# Patient Record
Sex: Male | Born: 1960 | Race: Black or African American | Hispanic: No | Marital: Single | Smoking: Never smoker
Health system: Southern US, Community
[De-identification: ages and names within clinical notes are randomized; demographics above are authoritative.]

## PROBLEM LIST (undated history)

## (undated) DIAGNOSIS — M720 Palmar fascial fibromatosis [Dupuytren]: Secondary | ICD-10-CM

## (undated) HISTORY — PX: TONSILLECTOMY: SUR1361

---

## 2005-09-11 HISTORY — PX: DUPUYTREN CONTRACTURE RELEASE: SHX1478

## 2014-08-11 ENCOUNTER — Other Ambulatory Visit: Payer: Self-pay | Admitting: Orthopedic Surgery

## 2014-09-11 DIAGNOSIS — M720 Palmar fascial fibromatosis [Dupuytren]: Secondary | ICD-10-CM

## 2014-09-11 HISTORY — DX: Palmar fascial fibromatosis (dupuytren): M72.0

## 2014-09-15 ENCOUNTER — Encounter (HOSPITAL_BASED_OUTPATIENT_CLINIC_OR_DEPARTMENT_OTHER): Payer: Self-pay | Admitting: *Deleted

## 2014-09-22 ENCOUNTER — Encounter (HOSPITAL_BASED_OUTPATIENT_CLINIC_OR_DEPARTMENT_OTHER)
Admission: RE | Disposition: A | Discharge status: Home / Self Care | Payer: Self-pay | Source: Ambulatory Visit | Attending: Orthopedic Surgery

## 2014-09-22 ENCOUNTER — Ambulatory Visit (HOSPITAL_BASED_OUTPATIENT_CLINIC_OR_DEPARTMENT_OTHER)
Admission: RE | Admit: 2014-09-22 | Discharge: 2014-09-22 | Disposition: A | Discharge status: Home / Self Care | Payer: BLUE CROSS/BLUE SHIELD | Source: Ambulatory Visit | Attending: Orthopedic Surgery | Admitting: Orthopedic Surgery

## 2014-09-22 ENCOUNTER — Encounter (HOSPITAL_BASED_OUTPATIENT_CLINIC_OR_DEPARTMENT_OTHER): Payer: Self-pay | Admitting: Certified Registered"

## 2014-09-22 ENCOUNTER — Ambulatory Visit (HOSPITAL_BASED_OUTPATIENT_CLINIC_OR_DEPARTMENT_OTHER): Payer: BLUE CROSS/BLUE SHIELD | Admitting: Certified Registered"

## 2014-09-22 DIAGNOSIS — M720 Palmar fascial fibromatosis [Dupuytren]: Secondary | ICD-10-CM | POA: Diagnosis present

## 2014-09-22 DIAGNOSIS — Z880 Allergy status to penicillin: Secondary | ICD-10-CM | POA: Diagnosis not present

## 2014-09-22 HISTORY — PX: FASCIECTOMY: SHX6525

## 2014-09-22 HISTORY — DX: Palmar fascial fibromatosis (dupuytren): M72.0

## 2014-09-22 LAB — POCT HEMOGLOBIN-HEMACUE: Hemoglobin: 15.4 g/dL (ref 13.0–17.0)

## 2014-09-22 SURGERY — FASCIECTOMY, PALM
Anesthesia: General | Site: Hand | Laterality: Right

## 2014-09-22 MED ORDER — HYDROMORPHONE HCL 1 MG/ML IJ SOLN
0.2500 mg | INTRAMUSCULAR | Status: DC | PRN
  Administered 2014-09-22 (×2): 0.5 mg via INTRAVENOUS

## 2014-09-22 MED ORDER — FENTANYL CITRATE 0.05 MG/ML IJ SOLN: 50.0000 ug | INTRAMUSCULAR | Status: DC | PRN

## 2014-09-22 MED ORDER — HYDROCODONE-ACETAMINOPHEN 5-325 MG PO TABS: ORAL_TABLET | ORAL | Status: DC

## 2014-09-22 MED ORDER — LIDOCAINE HCL (CARDIAC) 20 MG/ML IV SOLN
INTRAVENOUS | Status: DC | PRN
  Administered 2014-09-22: 60 mg via INTRAVENOUS

## 2014-09-22 MED ORDER — HYDROMORPHONE HCL 1 MG/ML IJ SOLN
INTRAMUSCULAR | Status: AC
  Filled 2014-09-22: qty 1

## 2014-09-22 MED ORDER — MIDAZOLAM HCL 2 MG/2ML IJ SOLN
INTRAMUSCULAR | Status: AC
  Filled 2014-09-22: qty 2

## 2014-09-22 MED ORDER — CEFAZOLIN SODIUM-DEXTROSE 2-3 GM-% IV SOLR
2.0000 g | INTRAVENOUS | Status: AC
  Administered 2014-09-22: 2 g via INTRAVENOUS

## 2014-09-22 MED ORDER — LACTATED RINGERS IV SOLN
INTRAVENOUS | Status: DC
  Administered 2014-09-22 (×2): via INTRAVENOUS

## 2014-09-22 MED ORDER — MIDAZOLAM HCL 5 MG/5ML IJ SOLN
INTRAMUSCULAR | Status: DC | PRN
  Administered 2014-09-22: 2 mg via INTRAVENOUS

## 2014-09-22 MED ORDER — BUPIVACAINE HCL (PF) 0.25 % IJ SOLN
INTRAMUSCULAR | Status: AC
  Filled 2014-09-22: qty 30

## 2014-09-22 MED ORDER — PROPOFOL 10 MG/ML IV BOLUS
INTRAVENOUS | Status: DC | PRN
  Administered 2014-09-22: 200 mg via INTRAVENOUS

## 2014-09-22 MED ORDER — ONDANSETRON HCL 4 MG/2ML IJ SOLN
INTRAMUSCULAR | Status: DC | PRN
  Administered 2014-09-22: 4 mg via INTRAVENOUS

## 2014-09-22 MED ORDER — BUPIVACAINE HCL (PF) 0.25 % IJ SOLN
INTRAMUSCULAR | Status: DC | PRN
  Administered 2014-09-22: 8 mL

## 2014-09-22 MED ORDER — THROMBIN 5000 UNITS EX SOLR
CUTANEOUS | Status: DC | PRN
  Administered 2014-09-22: 4000 [IU] via TOPICAL

## 2014-09-22 MED ORDER — MIDAZOLAM HCL 2 MG/2ML IJ SOLN: 1.0000 mg | INTRAMUSCULAR | Status: DC | PRN

## 2014-09-22 MED ORDER — CEFAZOLIN SODIUM-DEXTROSE 2-3 GM-% IV SOLR
INTRAVENOUS | Status: AC
  Filled 2014-09-22: qty 50

## 2014-09-22 MED ORDER — PROPOFOL 10 MG/ML IV EMUL
INTRAVENOUS | Status: AC
  Filled 2014-09-22: qty 50

## 2014-09-22 MED ORDER — FENTANYL CITRATE 0.05 MG/ML IJ SOLN
INTRAMUSCULAR | Status: AC
  Filled 2014-09-22: qty 6

## 2014-09-22 MED ORDER — DEXAMETHASONE SODIUM PHOSPHATE 10 MG/ML IJ SOLN
INTRAMUSCULAR | Status: DC | PRN
  Administered 2014-09-22: 10 mg via INTRAVENOUS

## 2014-09-22 MED ORDER — MIDAZOLAM HCL 2 MG/ML PO SYRP: 12.0000 mg | ORAL_SOLUTION | Freq: Once | ORAL | Status: DC | PRN

## 2014-09-22 MED ORDER — THROMBIN 5000 UNITS EX SOLR
CUTANEOUS | Status: AC
  Filled 2014-09-22: qty 5000

## 2014-09-22 MED ORDER — CHLORHEXIDINE GLUCONATE 4 % EX LIQD: 60.0000 mL | Freq: Once | CUTANEOUS | Status: DC

## 2014-09-22 MED ORDER — FENTANYL CITRATE 0.05 MG/ML IJ SOLN
INTRAMUSCULAR | Status: DC | PRN
  Administered 2014-09-22: 25 ug via INTRAVENOUS
  Administered 2014-09-22 (×2): 50 ug via INTRAVENOUS

## 2014-09-22 SURGICAL SUPPLY — 54 items
BANDAGE ELASTIC 3 VELCRO ST LF (GAUZE/BANDAGES/DRESSINGS) ×2 IMPLANT
BLADE MINI RND TIP GREEN BEAV (BLADE) IMPLANT
BLADE SURG 15 STRL LF DISP TIS (BLADE) ×2 IMPLANT
BLADE SURG 15 STRL SS (BLADE) ×2
BNDG COHESIVE 3X5 TAN STRL LF (GAUZE/BANDAGES/DRESSINGS) IMPLANT
BNDG ESMARK 4X9 LF (GAUZE/BANDAGES/DRESSINGS) ×2 IMPLANT
BNDG GAUZE ELAST 4 BULKY (GAUZE/BANDAGES/DRESSINGS) ×2 IMPLANT
CHLORAPREP W/TINT 26ML (MISCELLANEOUS) ×2 IMPLANT
CORDS BIPOLAR (ELECTRODE) ×2 IMPLANT
COVER BACK TABLE 60X90IN (DRAPES) ×2 IMPLANT
COVER MAYO STAND STRL (DRAPES) ×2 IMPLANT
CUFF TOURNIQUET SINGLE 18IN (TOURNIQUET CUFF) ×2 IMPLANT
DECANTER SPIKE VIAL GLASS SM (MISCELLANEOUS) ×2 IMPLANT
DRAPE EXTREMITY T 121X128X90 (DRAPE) ×2 IMPLANT
DRAPE SURG 17X23 STRL (DRAPES) ×2 IMPLANT
GAUZE SPONGE 4X4 12PLY STRL (GAUZE/BANDAGES/DRESSINGS) ×2 IMPLANT
GAUZE XEROFORM 1X8 LF (GAUZE/BANDAGES/DRESSINGS) ×2 IMPLANT
GLOVE BIO SURGEON STRL SZ7.5 (GLOVE) ×2 IMPLANT
GLOVE BIOGEL PI IND STRL 7.0 (GLOVE) ×1 IMPLANT
GLOVE BIOGEL PI IND STRL 8 (GLOVE) ×1 IMPLANT
GLOVE BIOGEL PI IND STRL 8.5 (GLOVE) ×1 IMPLANT
GLOVE BIOGEL PI INDICATOR 7.0 (GLOVE) ×1
GLOVE BIOGEL PI INDICATOR 8 (GLOVE) ×1
GLOVE BIOGEL PI INDICATOR 8.5 (GLOVE) ×1
GLOVE ECLIPSE 6.5 STRL STRAW (GLOVE) ×2 IMPLANT
GLOVE EXAM NITRILE LRG STRL (GLOVE) ×2 IMPLANT
GLOVE SURG ORTHO 8.0 STRL STRW (GLOVE) ×2 IMPLANT
GOWN STRL REUS W/ TWL LRG LVL3 (GOWN DISPOSABLE) ×1 IMPLANT
GOWN STRL REUS W/TWL LRG LVL3 (GOWN DISPOSABLE) ×1
GOWN STRL REUS W/TWL XL LVL3 (GOWN DISPOSABLE) ×4 IMPLANT
LOOP VESSEL MAXI BLUE (MISCELLANEOUS) ×2 IMPLANT
NDL SAFETY ECLIPSE 18X1.5 (NEEDLE) ×1 IMPLANT
NEEDLE HYPO 18GX1.5 SHARP (NEEDLE) ×1
NEEDLE HYPO 25X1 1.5 SAFETY (NEEDLE) ×2 IMPLANT
NS IRRIG 1000ML POUR BTL (IV SOLUTION) ×2 IMPLANT
PACK BASIN DAY SURGERY FS (CUSTOM PROCEDURE TRAY) ×2 IMPLANT
PAD CAST 3X4 CTTN HI CHSV (CAST SUPPLIES) ×1 IMPLANT
PADDING CAST ABS 3INX4YD NS (CAST SUPPLIES)
PADDING CAST ABS 4INX4YD NS (CAST SUPPLIES)
PADDING CAST ABS COTTON 3X4 (CAST SUPPLIES) IMPLANT
PADDING CAST ABS COTTON 4X4 ST (CAST SUPPLIES) IMPLANT
PADDING CAST COTTON 3X4 STRL (CAST SUPPLIES) ×1
SLEEVE SCD COMPRESS KNEE MED (MISCELLANEOUS) ×2 IMPLANT
SPLINT PLASTER CAST XFAST 3X15 (CAST SUPPLIES) IMPLANT
SPLINT PLASTER XTRA FASTSET 3X (CAST SUPPLIES)
STOCKINETTE 4X48 STRL (DRAPES) ×2 IMPLANT
SUT ETHILON 4 0 PS 2 18 (SUTURE) ×4 IMPLANT
SUT SILK 2 0 FS (SUTURE) IMPLANT
SUT SILK 4 0 PS 2 (SUTURE) ×2 IMPLANT
SUT VICRYL RAPIDE 4/0 PS 2 (SUTURE) IMPLANT
SYR BULB 3OZ (MISCELLANEOUS) ×2 IMPLANT
SYR CONTROL 10ML LL (SYRINGE) ×2 IMPLANT
TOWEL OR 17X24 6PK STRL BLUE (TOWEL DISPOSABLE) ×4 IMPLANT
UNDERPAD 30X30 INCONTINENT (UNDERPADS AND DIAPERS) IMPLANT

## 2014-09-22 NOTE — Anesthesia Preprocedure Evaluation (Addendum)
Anesthesia Evaluation  Patient identified by MRN, date of birth, ID band Patient awake    Reviewed: Allergy & Precautions, NPO status   Airway        Dental   Pulmonary  breath sounds clear to auscultation        Cardiovascular negative cardio ROS  Rhythm:Regular Rate:Normal     Neuro/Psych    GI/Hepatic Neg liver ROS,   Endo/Other  negative endocrine ROS  Renal/GU negative Renal ROS     Musculoskeletal   Abdominal   Peds  Hematology   Anesthesia Other Findings   Reproductive/Obstetrics                            Anesthesia Physical Anesthesia Plan  ASA: II  Anesthesia Plan: General   Post-op Pain Management:    Induction: Intravenous  Airway Management Planned: LMA  Additional Equipment:   Intra-op Plan:   Post-operative Plan: Extubation in OR  Informed Consent: I have reviewed the patients History and Physical, chart, labs and discussed the procedure including the risks, benefits and alternatives for the proposed anesthesia with the patient or authorized representative who has indicated his/her understanding and acceptance.     Plan Discussed with: CRNA and Anesthesiologist  Anesthesia Plan Comments:         Anesthesia Quick Evaluation

## 2014-09-22 NOTE — Anesthesia Postprocedure Evaluation (Signed)
  Anesthesia Post-op Note  Patient: Edgar Rodriguez  Procedure(s) Performed: Procedure(s): RIGHT PALM AND SMALL FINGER FASCIECTOMY (Right)  Patient Location: PACU  Anesthesia Type: General   Level of Consciousness: awake, alert  and oriented  Airway and Oxygen Therapy: Patient Spontanous Breathing  Post-op Pain: mild  Post-op Assessment: Post-op Vital signs reviewed  Post-op Vital Signs: Reviewed  Last Vitals:  Filed Vitals:   09/22/14 1100  BP: 120/73  Pulse: 64  Temp: 36.7 C  Resp: 16    Complications: No apparent anesthesia complications

## 2014-09-22 NOTE — H&P (Signed)
  Edgar Rodriguez is an 54 y.o. male.   Chief Complaint: right small finger Dupuytren's contracture HPI: 54 yo rhd male states he has had Dupuytren's contracture right small finger for many years.  Had fasciectomy in 2007 with subsequent recurrence.  PIP joint contracture.  He wishes to have repeat fasciectomy.  Past Medical History  Diagnosis Date  . Dupuytren's contracture of right hand 09/2014    right small finger    Past Surgical History  Procedure Laterality Date  . Tonsillectomy      age 89  . Dupuytren contracture release Right 2007    History reviewed. No pertinent family history. Social History:  reports that he has never smoked. He has never used smokeless tobacco. He reports that he drinks alcohol. He reports that he does not use illicit drugs.  Allergies:  Allergies  Allergen Reactions  . Penicillins Hives and Swelling    No prescriptions prior to admission    Results for orders placed or performed during the hospital encounter of 09/22/14 (from the past 48 hour(s))  Hemoglobin-hemacue, POC     Status: None   Collection Time: 09/22/14  7:40 AM  Result Value Ref Range   Hemoglobin 15.4 13.0 - 17.0 g/dL    No results found.   A comprehensive review of systems was negative.  Blood pressure 155/80, pulse 73, temperature 98.1 F (36.7 C), temperature source Oral, resp. rate 18, height 6' (1.829 m), weight 94.915 kg (209 lb 4 oz), SpO2 100 %.  General appearance: alert, cooperative and appears stated age Head: Normocephalic, without obvious abnormality, atraumatic Neck: supple, symmetrical, trachea midline Resp: clear to auscultation bilaterally Cardio: regular rate and rhythm GI: non tender Extremities: intact sensation and capillary refill all digits.  +epl/fpl/io.  no wounds. Pulses: 2+ and symmetric Skin: Skin color, texture, turgor normal. No rashes or lesions Neurologic: Grossly normal Incision/Wound: none  Assessment/Plan Right small finger  Dupuytren's contracture.  Non operative and operative treatment options were discussed with the patient and patient wishes to proceed with operative treatment. He wishes to have repeat fasciectomy.  Risks, benefits, and alternatives of surgery were discussed and the patient agrees with the plan of care.   Roel Douthat R 09/22/2014, 8:31 AM

## 2014-09-22 NOTE — Transfer of Care (Signed)
Immediate Anesthesia Transfer of Care Note  Patient: Edgar Rodriguez  Procedure(s) Performed: Procedure(s): RIGHT PALM AND SMALL FINGER FASCIECTOMY (Right)  Patient Location: PACU  Anesthesia Type:General  Level of Consciousness: awake and patient cooperative  Airway & Oxygen Therapy: Patient Spontanous Breathing and Patient connected to face mask oxygen  Post-op Assessment: Report given to PACU RN and Post -op Vital signs reviewed and stable  Post vital signs: Reviewed and stable  Complications: No apparent anesthesia complications

## 2014-09-22 NOTE — Op Note (Signed)
[id number] 

## 2014-09-22 NOTE — Anesthesia Procedure Notes (Signed)
Procedure Name: LMA Insertion Date/Time: 09/22/2014 8:37 AM Performed by: Sayed Apostol Pre-anesthesia Checklist: Patient identified, Emergency Drugs available, Suction available and Patient being monitored Patient Re-evaluated:Patient Re-evaluated prior to inductionOxygen Delivery Method: Circle System Utilized Preoxygenation: Pre-oxygenation with 100% oxygen Intubation Type: IV induction Ventilation: Mask ventilation without difficulty LMA: LMA inserted LMA Size: 4.0 Number of attempts: 1 Airway Equipment and Method: bite block Placement Confirmation: positive ETCO2 Tube secured with: Tape Dental Injury: Teeth and Oropharynx as per pre-operative assessment

## 2014-09-22 NOTE — Discharge Instructions (Addendum)

## 2014-09-22 NOTE — Op Note (Signed)
NAMEAUDRICK, LAMOUREAUX               ACCOUNT NO.:  [id number]  MEDICAL RECORD NO.:  [id number]  LOCATION:                                 FACILITY:  PHYSICIAN:  Leanora Cover, MD             DATE OF BIRTH:  DATE OF PROCEDURE:  09/22/2014 DATE OF DISCHARGE:                              OPERATIVE REPORT   PREOPERATIVE DIAGNOSIS:  Right small finger Dupuytren's contracture including MP and PIP joints.  POSTOPERATIVE DIAGNOSIS:  Right small finger Dupuytren's contracture including MP and PIP joints.  PROCEDURE:  Right palm and small finger fasciectomy with PIP joint release.  SURGEON:  Leanora Cover, MD  ASSISTANT:  Daryll Brod, M.D.  ANESTHESIA:  General.  IV FLUIDS:  Per anesthesia flow sheet.  ESTIMATED BLOOD LOSS:  Minimal.  COMPLICATIONS:  None.  SPECIMENS:  Dupuytren's contracture to Pathology.  TOURNIQUET TIME:  49 minutes.  DISPOSITION:  Stable to PACU.  INDICATIONS:  Mr. Priola is a 54 year old male who had a history of Dupuytren's contracture, right small finger.  This had been treated with fasciectomy one time in 2007.  This recurred at this time, were reexcised.  Risks, benefits, and alternatives to surgery were discussed including risk of blood loss; infection; damage to nerves, vessels, tendons, ligaments, bone; failure of surgery; need for additional surgery; complications with wound healing; continued pain; and recurrence of contracture.  He voiced understanding of these risks and elected to proceed.  OPERATIVE COURSE:  After being identified preoperatively by myself, the patient and I agreed upon procedure and site of procedure.  Surgical site was marked.  Risks, benefits, and alternatives of surgery were reviewed and he wished to proceed.  Surgical consent had been signed. He was given IV Ancef as preoperative antibiotic prophylaxis after a test dose previously, no reaction.  He was transferred to the operating room and placed on the operating room  table in supine position with the right upper extremity on arm board.  General anesthesia was induced by Anesthesiology.  Right upper extremity was prepped and draped in normal sterile orthopedic fashion.  Surgical pause was performed between surgeons, anesthesia, and operating room staff, and all were in agreement as to the patient, procedure, and site of procedure. Tourniquet at the proximal aspect of the extremity was inflated to 250 mmHg after exsanguination of the limb with Esmarch bandage.  The previous incision was followed.  This was carried into subcutaneous tissues by spreading technique.  There is dense adherence of the Dupuytren's cord to the under surface of the skin.  Great care was taken in releasing this.  The radial and ulnar digital nerve and artery were identified and protected throughout the case.  They were traced from proximal to distal freeing them up along the course.  The Dupuytren's cord was adherent to the sheath as well.  It was removed from approximately the palmar flexion crease out to the PIP joint.  The cord was sent to Pathology for examination.  The PIP joint still had some contracture.  It was treated with manipulation.  The sheath was released transversely between the A2 and A4 pulleys.  This provided some additional extension.  Check rein ligaments were released as well. Improved extension of the PIP joint was obtained, but did not come out into full extension.  The wound was copiously irrigated with sterile saline.  It was treated with thrombin spray.  A vessel loop drain was placed as a drain.  4-0 nylon suture was used in an interrupted fashion to reapproximate the skin edges.  The wound was injected with 8 mL of 0.25% plain Marcaine to aid in postoperative analgesia.  He was then dressed with sterile Xeroform, 4x4s, and wrapped with Kerlix and Ace bandage.  The tourniquet deflated at 49 minutes.  Fingertips were pink with brisk capillary refill  after deflation of the tourniquet. Operative drapes were broken down.  The patient was awoken from anesthesia safely.  He was transferred back to stretcher and taken to PACU in stable condition.  I will see him back in the office in 1 week for postoperative followup.  I will give him Norco 5/325, 1-2 p.o. q.6 hours p.r.n. pain dispensed #40.     Leanora Cover, MD     KK/MEDQ  D:  09/22/2014  T:  09/22/2014  Job:  [id number]

## 2014-09-22 NOTE — Brief Op Note (Signed)
09/22/2014  9:49 AM  PATIENT:  Marya Landry  54 y.o. male  PRE-OPERATIVE DIAGNOSIS:  RIGHT SMALL FINGER DUPUYTRENS CONTRACTURE   POST-OPERATIVE DIAGNOSIS:  RIGHT SMALL FINGER DUPUYTRENS CONTRACTURE   PROCEDURE:  Procedure(s): RIGHT PALM AND SMALL FINGER FASCIECTOMY (Right)  SURGEON:  Surgeon(s) and Role:    * Leanora Cover, MD - Primary    * Daryll Brod, MD - Assisting  PHYSICIAN ASSISTANT:   ASSISTANTS: Daryll Brod, MD   ANESTHESIA:   general  EBL:  Total I/O In: 1400 [I.V.:1400] Out: -   BLOOD ADMINISTERED:none  DRAINS: vessel loop drain  LOCAL MEDICATIONS USED:  MARCAINE     SPECIMEN:  Source of Specimen:  right small finger  DISPOSITION OF SPECIMEN:  PATHOLOGY  COUNTS:  YES  TOURNIQUET:   Total Tourniquet Time Documented: Upper Arm (Right) - 49 minutes Total: Upper Arm (Right) - 49 minutes   DICTATION: .Other Dictation: Dictation Number [id number]  PLAN OF CARE: Discharge to home after PACU  PATIENT DISPOSITION:  PACU - hemodynamically stable.

## 2014-09-23 ENCOUNTER — Encounter (HOSPITAL_BASED_OUTPATIENT_CLINIC_OR_DEPARTMENT_OTHER): Payer: Self-pay | Admitting: Orthopedic Surgery

## 2016-02-02 DIAGNOSIS — R7309 Other abnormal glucose: Secondary | ICD-10-CM | POA: Diagnosis not present

## 2016-02-02 DIAGNOSIS — M720 Palmar fascial fibromatosis [Dupuytren]: Secondary | ICD-10-CM | POA: Diagnosis not present

## 2016-02-02 DIAGNOSIS — Z0000 Encounter for general adult medical examination without abnormal findings: Secondary | ICD-10-CM | POA: Diagnosis not present

## 2016-02-09 DIAGNOSIS — Z1212 Encounter for screening for malignant neoplasm of rectum: Secondary | ICD-10-CM | POA: Diagnosis not present

## 2016-02-09 DIAGNOSIS — Z86010 Personal history of colonic polyps: Secondary | ICD-10-CM | POA: Diagnosis not present

## 2016-02-09 DIAGNOSIS — R972 Elevated prostate specific antigen [PSA]: Secondary | ICD-10-CM | POA: Diagnosis not present

## 2016-02-09 DIAGNOSIS — Z1389 Encounter for screening for other disorder: Secondary | ICD-10-CM | POA: Diagnosis not present

## 2016-02-09 DIAGNOSIS — R7309 Other abnormal glucose: Secondary | ICD-10-CM | POA: Diagnosis not present

## 2016-02-09 DIAGNOSIS — Z0000 Encounter for general adult medical examination without abnormal findings: Secondary | ICD-10-CM | POA: Diagnosis not present

## 2016-02-09 DIAGNOSIS — Z800 Family history of malignant neoplasm of digestive organs: Secondary | ICD-10-CM | POA: Diagnosis not present

## 2016-02-09 DIAGNOSIS — Z125 Encounter for screening for malignant neoplasm of prostate: Secondary | ICD-10-CM | POA: Diagnosis not present

## 2016-05-12 DIAGNOSIS — K4020 Bilateral inguinal hernia, without obstruction or gangrene, not specified as recurrent: Secondary | ICD-10-CM | POA: Diagnosis not present

## 2016-05-12 DIAGNOSIS — R972 Elevated prostate specific antigen [PSA]: Secondary | ICD-10-CM | POA: Diagnosis not present

## 2016-06-21 DIAGNOSIS — Z86010 Personal history of colonic polyps: Secondary | ICD-10-CM | POA: Diagnosis not present

## 2016-07-24 DIAGNOSIS — R7309 Other abnormal glucose: Secondary | ICD-10-CM | POA: Diagnosis not present

## 2016-07-24 DIAGNOSIS — H1131 Conjunctival hemorrhage, right eye: Secondary | ICD-10-CM | POA: Diagnosis not present

## 2016-07-24 DIAGNOSIS — H1031 Unspecified acute conjunctivitis, right eye: Secondary | ICD-10-CM | POA: Diagnosis not present

## 2016-07-24 DIAGNOSIS — Z6827 Body mass index (BMI) 27.0-27.9, adult: Secondary | ICD-10-CM | POA: Diagnosis not present

## 2016-07-24 DIAGNOSIS — H04123 Dry eye syndrome of bilateral lacrimal glands: Secondary | ICD-10-CM | POA: Diagnosis not present

## 2016-11-15 DIAGNOSIS — R972 Elevated prostate specific antigen [PSA]: Secondary | ICD-10-CM | POA: Diagnosis not present

## 2016-11-22 DIAGNOSIS — R972 Elevated prostate specific antigen [PSA]: Secondary | ICD-10-CM | POA: Diagnosis not present

## 2017-02-08 DIAGNOSIS — R7309 Other abnormal glucose: Secondary | ICD-10-CM | POA: Diagnosis not present

## 2017-02-08 DIAGNOSIS — Z0000 Encounter for general adult medical examination without abnormal findings: Secondary | ICD-10-CM | POA: Diagnosis not present

## 2017-02-08 DIAGNOSIS — Z125 Encounter for screening for malignant neoplasm of prostate: Secondary | ICD-10-CM | POA: Diagnosis not present

## 2017-02-14 DIAGNOSIS — Z0000 Encounter for general adult medical examination without abnormal findings: Secondary | ICD-10-CM | POA: Diagnosis not present

## 2017-02-14 DIAGNOSIS — R7309 Other abnormal glucose: Secondary | ICD-10-CM | POA: Diagnosis not present

## 2017-02-14 DIAGNOSIS — Z1389 Encounter for screening for other disorder: Secondary | ICD-10-CM | POA: Diagnosis not present

## 2017-02-14 DIAGNOSIS — M720 Palmar fascial fibromatosis [Dupuytren]: Secondary | ICD-10-CM | POA: Diagnosis not present

## 2017-02-14 DIAGNOSIS — R972 Elevated prostate specific antigen [PSA]: Secondary | ICD-10-CM | POA: Diagnosis not present

## 2017-02-14 DIAGNOSIS — Z800 Family history of malignant neoplasm of digestive organs: Secondary | ICD-10-CM | POA: Diagnosis not present

## 2017-02-28 DIAGNOSIS — M720 Palmar fascial fibromatosis [Dupuytren]: Secondary | ICD-10-CM | POA: Diagnosis not present

## 2017-08-10 DIAGNOSIS — R972 Elevated prostate specific antigen [PSA]: Secondary | ICD-10-CM | POA: Diagnosis not present

## 2017-08-22 DIAGNOSIS — N400 Enlarged prostate without lower urinary tract symptoms: Secondary | ICD-10-CM | POA: Diagnosis not present

## 2017-08-22 DIAGNOSIS — R972 Elevated prostate specific antigen [PSA]: Secondary | ICD-10-CM | POA: Diagnosis not present

## 2017-08-24 DIAGNOSIS — H6123 Impacted cerumen, bilateral: Secondary | ICD-10-CM | POA: Diagnosis not present

## 2017-08-24 DIAGNOSIS — Z6828 Body mass index (BMI) 28.0-28.9, adult: Secondary | ICD-10-CM | POA: Diagnosis not present

## 2017-10-22 DIAGNOSIS — H5203 Hypermetropia, bilateral: Secondary | ICD-10-CM | POA: Diagnosis not present

## 2017-10-22 DIAGNOSIS — H524 Presbyopia: Secondary | ICD-10-CM | POA: Diagnosis not present

## 2018-02-20 DIAGNOSIS — R7309 Other abnormal glucose: Secondary | ICD-10-CM | POA: Diagnosis not present

## 2018-02-20 DIAGNOSIS — Z125 Encounter for screening for malignant neoplasm of prostate: Secondary | ICD-10-CM | POA: Diagnosis not present

## 2018-02-20 DIAGNOSIS — Z0000 Encounter for general adult medical examination without abnormal findings: Secondary | ICD-10-CM | POA: Diagnosis not present

## 2018-02-28 DIAGNOSIS — Z1212 Encounter for screening for malignant neoplasm of rectum: Secondary | ICD-10-CM | POA: Diagnosis not present

## 2018-03-04 DIAGNOSIS — R740 Nonspecific elevation of levels of transaminase and lactic acid dehydrogenase [LDH]: Secondary | ICD-10-CM | POA: Diagnosis not present

## 2018-03-04 DIAGNOSIS — Z1389 Encounter for screening for other disorder: Secondary | ICD-10-CM | POA: Diagnosis not present

## 2018-03-04 DIAGNOSIS — R7309 Other abnormal glucose: Secondary | ICD-10-CM | POA: Diagnosis not present

## 2018-03-04 DIAGNOSIS — R972 Elevated prostate specific antigen [PSA]: Secondary | ICD-10-CM | POA: Diagnosis not present

## 2018-03-04 DIAGNOSIS — Z0000 Encounter for general adult medical examination without abnormal findings: Secondary | ICD-10-CM | POA: Diagnosis not present

## 2018-03-04 DIAGNOSIS — Z800 Family history of malignant neoplasm of digestive organs: Secondary | ICD-10-CM | POA: Diagnosis not present

## 2018-05-23 DIAGNOSIS — R310 Gross hematuria: Secondary | ICD-10-CM | POA: Diagnosis not present

## 2018-05-23 DIAGNOSIS — R8271 Bacteriuria: Secondary | ICD-10-CM | POA: Diagnosis not present

## 2018-06-26 DIAGNOSIS — R972 Elevated prostate specific antigen [PSA]: Secondary | ICD-10-CM | POA: Diagnosis not present

## 2018-07-03 DIAGNOSIS — R310 Gross hematuria: Secondary | ICD-10-CM | POA: Diagnosis not present

## 2018-07-03 DIAGNOSIS — R972 Elevated prostate specific antigen [PSA]: Secondary | ICD-10-CM | POA: Diagnosis not present

## 2018-11-12 DIAGNOSIS — H338 Other retinal detachments: Secondary | ICD-10-CM | POA: Diagnosis not present

## 2018-11-12 DIAGNOSIS — H5203 Hypermetropia, bilateral: Secondary | ICD-10-CM | POA: Diagnosis not present

## 2018-11-12 DIAGNOSIS — R7303 Prediabetes: Secondary | ICD-10-CM | POA: Diagnosis not present

## 2018-11-12 DIAGNOSIS — H524 Presbyopia: Secondary | ICD-10-CM | POA: Diagnosis not present

## 2019-03-03 DIAGNOSIS — R7309 Other abnormal glucose: Secondary | ICD-10-CM | POA: Diagnosis not present

## 2019-03-03 DIAGNOSIS — R82998 Other abnormal findings in urine: Secondary | ICD-10-CM | POA: Diagnosis not present

## 2019-03-03 DIAGNOSIS — Z125 Encounter for screening for malignant neoplasm of prostate: Secondary | ICD-10-CM | POA: Diagnosis not present

## 2019-03-03 DIAGNOSIS — R740 Nonspecific elevation of levels of transaminase and lactic acid dehydrogenase [LDH]: Secondary | ICD-10-CM | POA: Diagnosis not present

## 2019-03-03 DIAGNOSIS — Z0000 Encounter for general adult medical examination without abnormal findings: Secondary | ICD-10-CM | POA: Diagnosis not present

## 2019-03-10 DIAGNOSIS — E559 Vitamin D deficiency, unspecified: Secondary | ICD-10-CM | POA: Diagnosis not present

## 2019-03-10 DIAGNOSIS — Z0000 Encounter for general adult medical examination without abnormal findings: Secondary | ICD-10-CM | POA: Diagnosis not present

## 2019-03-10 DIAGNOSIS — Z800 Family history of malignant neoplasm of digestive organs: Secondary | ICD-10-CM | POA: Diagnosis not present

## 2019-03-10 DIAGNOSIS — R7309 Other abnormal glucose: Secondary | ICD-10-CM | POA: Diagnosis not present

## 2019-03-10 DIAGNOSIS — Z1331 Encounter for screening for depression: Secondary | ICD-10-CM | POA: Diagnosis not present

## 2019-03-10 DIAGNOSIS — Z86010 Personal history of colonic polyps: Secondary | ICD-10-CM | POA: Diagnosis not present

## 2019-03-10 DIAGNOSIS — R972 Elevated prostate specific antigen [PSA]: Secondary | ICD-10-CM | POA: Diagnosis not present

## 2019-06-27 DIAGNOSIS — R972 Elevated prostate specific antigen [PSA]: Secondary | ICD-10-CM | POA: Diagnosis not present

## 2019-07-04 DIAGNOSIS — R972 Elevated prostate specific antigen [PSA]: Secondary | ICD-10-CM | POA: Diagnosis not present

## 2019-07-04 DIAGNOSIS — R310 Gross hematuria: Secondary | ICD-10-CM | POA: Diagnosis not present

## 2019-09-19 DIAGNOSIS — M720 Palmar fascial fibromatosis [Dupuytren]: Secondary | ICD-10-CM | POA: Diagnosis not present

## 2019-09-22 DIAGNOSIS — M25511 Pain in right shoulder: Secondary | ICD-10-CM | POA: Diagnosis not present

## 2019-09-22 DIAGNOSIS — M7551 Bursitis of right shoulder: Secondary | ICD-10-CM | POA: Diagnosis not present

## 2019-10-13 DIAGNOSIS — R972 Elevated prostate specific antigen [PSA]: Secondary | ICD-10-CM | POA: Diagnosis not present

## 2019-10-19 ENCOUNTER — Ambulatory Visit: Payer: BC Managed Care – PPO | Attending: Internal Medicine

## 2019-10-19 DIAGNOSIS — Z23 Encounter for immunization: Secondary | ICD-10-CM | POA: Insufficient documentation

## 2019-10-19 NOTE — Progress Notes (Signed)
   Covid-19 Vaccination Clinic  Name:  Edgar Rodriguez    MRN: [id number] DOB: 09/20/1960  10/19/2019  [name] was observed post Covid-19 immunization for 15 minutes without incidence. He was provided with Vaccine Information Sheet and instruction to access the V-Safe system.   [name] was instructed to call 911 with any severe reactions post vaccine: Marland Kitchen Difficulty breathing  . Swelling of your face and throat  . A fast heartbeat  . A bad rash all over your body  . Dizziness and weakness    Immunizations Administered    Name Date Dose VIS Date Route   Pfizer COVID-19 Vaccine 10/19/2019  4:53 PM 0.3 mL 08/22/2019 Intramuscular   Manufacturer: [place]   Lot: [id number]   [place]: [id number]

## 2019-11-13 ENCOUNTER — Ambulatory Visit: Payer: BC Managed Care – PPO | Attending: Internal Medicine

## 2019-11-13 DIAGNOSIS — Z23 Encounter for immunization: Secondary | ICD-10-CM | POA: Insufficient documentation

## 2019-11-13 NOTE — Progress Notes (Signed)
   Covid-19 Vaccination Clinic  Name:  Edgar Rodriguez    MRN: [id number] DOB: 12-Jul-1961  11/13/2019  Edgar Rodriguez was observed post Covid-19 immunization for 15 minutes without incident. He was provided with Vaccine Information Sheet and instruction to access the V-Safe system.   Edgar Rodriguez was instructed to call 911 with any severe reactions post vaccine: Marland Kitchen Difficulty breathing  . Swelling of face and throat  . A fast heartbeat  . A bad rash all over body  . Dizziness and weakness   Immunizations Administered    Name Date Dose VIS Date Route   Pfizer COVID-19 Vaccine 11/13/2019  3:12 PM 0.3 mL 08/22/2019 Intramuscular   Manufacturer: [place]   Lot: [id number]   [place]: [id number]

## 2019-11-17 DIAGNOSIS — H35711 Central serous chorioretinopathy, right eye: Secondary | ICD-10-CM | POA: Diagnosis not present

## 2019-11-17 DIAGNOSIS — H5203 Hypermetropia, bilateral: Secondary | ICD-10-CM | POA: Diagnosis not present

## 2019-11-17 DIAGNOSIS — R7303 Prediabetes: Secondary | ICD-10-CM | POA: Diagnosis not present

## 2020-01-07 ENCOUNTER — Ambulatory Visit: Payer: BC Managed Care – PPO | Attending: Internal Medicine

## 2020-01-07 DIAGNOSIS — Z20822 Contact with and (suspected) exposure to covid-19: Secondary | ICD-10-CM | POA: Diagnosis not present

## 2020-01-08 LAB — SARS-COV-2, NAA 2 DAY TAT

## 2020-01-08 LAB — NOVEL CORONAVIRUS, NAA: SARS-CoV-2, NAA: NOT DETECTED

## 2020-01-19 DIAGNOSIS — R972 Elevated prostate specific antigen [PSA]: Secondary | ICD-10-CM | POA: Diagnosis not present

## 2020-03-17 DIAGNOSIS — E559 Vitamin D deficiency, unspecified: Secondary | ICD-10-CM | POA: Diagnosis not present

## 2020-03-17 DIAGNOSIS — Z125 Encounter for screening for malignant neoplasm of prostate: Secondary | ICD-10-CM | POA: Diagnosis not present

## 2020-03-17 DIAGNOSIS — R7989 Other specified abnormal findings of blood chemistry: Secondary | ICD-10-CM | POA: Diagnosis not present

## 2020-03-17 DIAGNOSIS — Z0000 Encounter for general adult medical examination without abnormal findings: Secondary | ICD-10-CM | POA: Diagnosis not present

## 2020-03-17 DIAGNOSIS — R7309 Other abnormal glucose: Secondary | ICD-10-CM | POA: Diagnosis not present

## 2020-03-22 DIAGNOSIS — Z1212 Encounter for screening for malignant neoplasm of rectum: Secondary | ICD-10-CM | POA: Diagnosis not present

## 2020-03-24 DIAGNOSIS — R972 Elevated prostate specific antigen [PSA]: Secondary | ICD-10-CM | POA: Diagnosis not present

## 2020-03-24 DIAGNOSIS — R7309 Other abnormal glucose: Secondary | ICD-10-CM | POA: Diagnosis not present

## 2020-03-24 DIAGNOSIS — Z86010 Personal history of colonic polyps: Secondary | ICD-10-CM | POA: Diagnosis not present

## 2020-03-24 DIAGNOSIS — E559 Vitamin D deficiency, unspecified: Secondary | ICD-10-CM | POA: Diagnosis not present

## 2020-03-24 DIAGNOSIS — Z0000 Encounter for general adult medical examination without abnormal findings: Secondary | ICD-10-CM | POA: Diagnosis not present

## 2020-04-19 DIAGNOSIS — N400 Enlarged prostate without lower urinary tract symptoms: Secondary | ICD-10-CM | POA: Diagnosis not present

## 2020-04-30 ENCOUNTER — Other Ambulatory Visit: Payer: Self-pay | Admitting: Urology

## 2020-04-30 DIAGNOSIS — R972 Elevated prostate specific antigen [PSA]: Secondary | ICD-10-CM

## 2020-05-24 ENCOUNTER — Other Ambulatory Visit: Payer: Self-pay | Admitting: Urology

## 2020-05-25 ENCOUNTER — Other Ambulatory Visit: Payer: Self-pay

## 2020-05-25 ENCOUNTER — Ambulatory Visit
Admission: RE | Admit: 2020-05-25 | Discharge: 2020-05-25 | Disposition: A | Discharge status: Home / Self Care | Payer: BC Managed Care – PPO | Source: Ambulatory Visit | Attending: Urology | Admitting: Urology

## 2020-05-25 DIAGNOSIS — R972 Elevated prostate specific antigen [PSA]: Secondary | ICD-10-CM

## 2020-05-25 DIAGNOSIS — N400 Enlarged prostate without lower urinary tract symptoms: Secondary | ICD-10-CM | POA: Diagnosis not present

## 2020-05-25 MED ORDER — GADOBENATE DIMEGLUMINE 529 MG/ML IV SOLN
19.0000 mL | Freq: Once | INTRAVENOUS | Status: AC | PRN
  Administered 2020-05-25: 19 mL via INTRAVENOUS

## 2020-06-11 DIAGNOSIS — N4232 Atypical small acinar proliferation of prostate: Secondary | ICD-10-CM | POA: Diagnosis not present

## 2020-06-11 DIAGNOSIS — D075 Carcinoma in situ of prostate: Secondary | ICD-10-CM | POA: Diagnosis not present

## 2020-06-11 DIAGNOSIS — C61 Malignant neoplasm of prostate: Secondary | ICD-10-CM | POA: Diagnosis not present

## 2020-06-11 DIAGNOSIS — R972 Elevated prostate specific antigen [PSA]: Secondary | ICD-10-CM | POA: Diagnosis not present

## 2020-07-12 DIAGNOSIS — C61 Malignant neoplasm of prostate: Secondary | ICD-10-CM | POA: Diagnosis not present

## 2020-11-29 DIAGNOSIS — R7303 Prediabetes: Secondary | ICD-10-CM | POA: Diagnosis not present

## 2020-11-29 DIAGNOSIS — H524 Presbyopia: Secondary | ICD-10-CM | POA: Diagnosis not present

## 2020-11-29 DIAGNOSIS — H2513 Age-related nuclear cataract, bilateral: Secondary | ICD-10-CM | POA: Diagnosis not present

## 2020-11-29 DIAGNOSIS — H35711 Central serous chorioretinopathy, right eye: Secondary | ICD-10-CM | POA: Diagnosis not present

## 2020-12-23 DIAGNOSIS — C61 Malignant neoplasm of prostate: Secondary | ICD-10-CM | POA: Diagnosis not present

## 2020-12-31 DIAGNOSIS — C61 Malignant neoplasm of prostate: Secondary | ICD-10-CM | POA: Diagnosis not present

## 2021-03-23 DIAGNOSIS — Z0000 Encounter for general adult medical examination without abnormal findings: Secondary | ICD-10-CM | POA: Diagnosis not present

## 2021-03-23 DIAGNOSIS — E559 Vitamin D deficiency, unspecified: Secondary | ICD-10-CM | POA: Diagnosis not present

## 2021-03-23 DIAGNOSIS — Z125 Encounter for screening for malignant neoplasm of prostate: Secondary | ICD-10-CM | POA: Diagnosis not present

## 2021-03-23 DIAGNOSIS — R7309 Other abnormal glucose: Secondary | ICD-10-CM | POA: Diagnosis not present

## 2021-03-29 DIAGNOSIS — R82998 Other abnormal findings in urine: Secondary | ICD-10-CM | POA: Diagnosis not present

## 2021-03-30 DIAGNOSIS — Z1339 Encounter for screening examination for other mental health and behavioral disorders: Secondary | ICD-10-CM | POA: Diagnosis not present

## 2021-03-30 DIAGNOSIS — Z0000 Encounter for general adult medical examination without abnormal findings: Secondary | ICD-10-CM | POA: Diagnosis not present

## 2021-03-30 DIAGNOSIS — Z1331 Encounter for screening for depression: Secondary | ICD-10-CM | POA: Diagnosis not present

## 2021-03-30 DIAGNOSIS — E559 Vitamin D deficiency, unspecified: Secondary | ICD-10-CM | POA: Diagnosis not present

## 2021-04-29 ENCOUNTER — Other Ambulatory Visit: Payer: Self-pay | Admitting: Urology

## 2021-04-29 DIAGNOSIS — C61 Malignant neoplasm of prostate: Secondary | ICD-10-CM

## 2021-06-02 ENCOUNTER — Ambulatory Visit
Admission: RE | Admit: 2021-06-02 | Discharge: 2021-06-02 | Disposition: A | Discharge status: Home / Self Care | Payer: BC Managed Care – PPO | Source: Ambulatory Visit | Attending: Urology | Admitting: Urology

## 2021-06-02 ENCOUNTER — Other Ambulatory Visit: Payer: Self-pay

## 2021-06-02 DIAGNOSIS — C61 Malignant neoplasm of prostate: Secondary | ICD-10-CM

## 2021-06-02 DIAGNOSIS — N400 Enlarged prostate without lower urinary tract symptoms: Secondary | ICD-10-CM | POA: Diagnosis not present

## 2021-06-02 IMAGING — MR MR PROSTATE WO/W CM
12 series · 48 of 48 positions shown · IV contrast (multihance)
Comparison: None.

CLINICAL DATA: Elevated PSA level of 6.632 ng/mL on [DATE].
Biopsy on [DATE] Gleason 3+3=6 prostate adenocarcinoma in the
right anterior peripheral zone mid gland (prior region of interest #
2). Atypical small acinar proliferation at prior region of interest
# 1.

EXAM:
MR PROSTATE WITHOUT AND WITH CONTRAST
TECHNIQUE: Multiplanar multisequence MRI images were obtained of the pelvis
centered about the prostate. Pre and post contrast images were
obtained.
CONTRAST:  19mL MULTIHANCE GADOBENATE DIMEGLUMINE 529 MG/ML IV SOLN

[Series 3: T2 · coronal · 3.0mm · 0.56mm/px · 1 of 25 slices shown (1 of 3)]
[im 1/25]
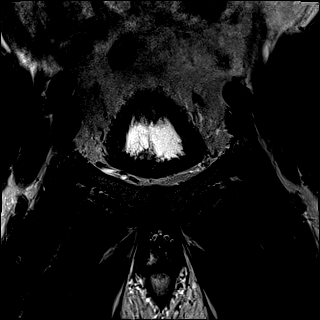

[Series 4: T1 · axial · 5.0mm · 1.25mm/px · z∈[-19,+176]mm · 2 of 80 slices shown]
[im 1/80]
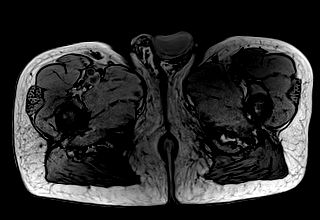
[im 80/80]
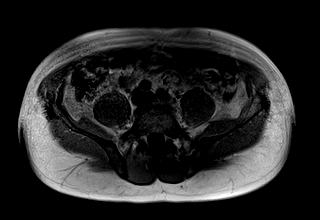

[Series 5: DWI · axial · 3.0mm · 1.75mm/px · z∈[+26,+95]mm · 2 of 72 slices shown (1 of 3)]
[im 1/72]
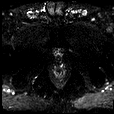
[im 72/72]
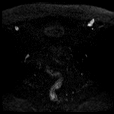

[Series 6: DWI · axial · 3.0mm · 1.75mm/px · 1 of 24 slices shown (2 of 3)]
[im 1/24]
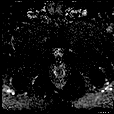

[Series 7: DWI · axial · 3.0mm · 1.75mm/px · 1 of 24 slices shown (3 of 3)]
[im 1/24]
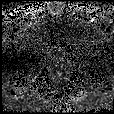

[Series 8: T2 · axial · 3.0mm · 0.56mm/px · 1 of 23 slices shown (2 of 3)]
[im 1/23]
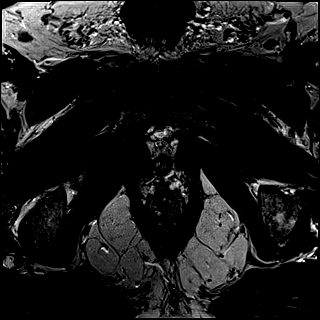

[Series 9: T2 · axial · 1.0mm · 1.04mm/px · z∈[+25,+96]mm · 2 of 72 slices shown (3 of 3)]
[im 1/72]
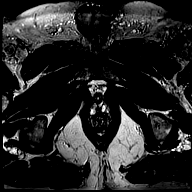
[im 72/72]
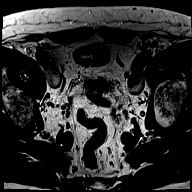

[Series 10: pre t1_twist_tra_dyn · axial · non-contrast · 3.5mm · 0.83mm/px · 1 of 20 slices shown]
[im 1/20]
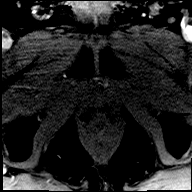

[Series 11: post t1_twist_tra_dyn-copy center · axial · non-contrast · 3.5mm · 0.83mm/px · z∈[+27,+94]mm · 17 of 600 slices shown]
[im 1/600]
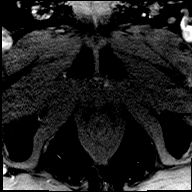
[im 38/600]
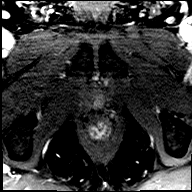
[im 75/600]
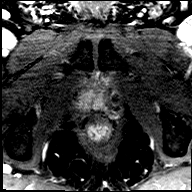
[im 113/600]
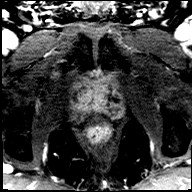
[im 150/600]
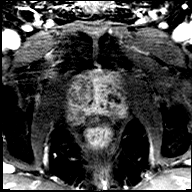
[im 188/600]
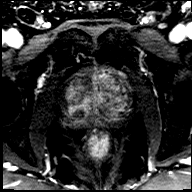
[im 225/600]
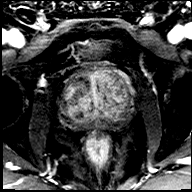
[im 263/600]
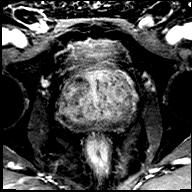
[im 300/600]
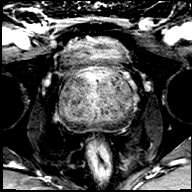
[im 337/600]
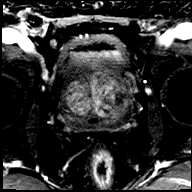
[im 375/600]
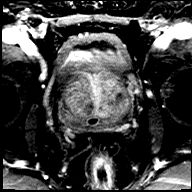
[im 412/600]
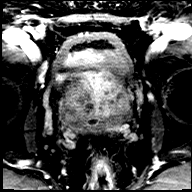
[im 450/600]
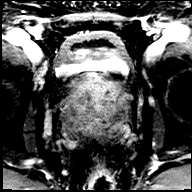
[im 487/600]
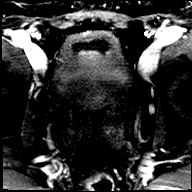
[im 525/600]
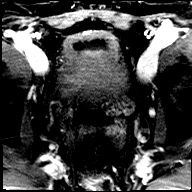
[im 562/600]
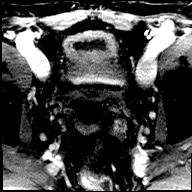
[im 600/600]
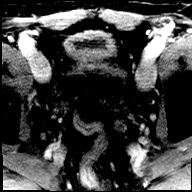

[Series 12: post t1_twist_tra_dyn-copy cent_sub · axial · 3.5mm · 0.83mm/px · z∈[+27,+94]mm · 16 of 572 slices shown]
[im 1/572]
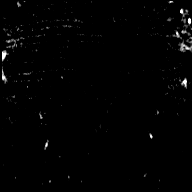
[im 39/572]
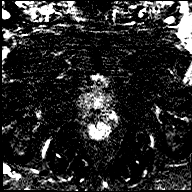
[im 77/572]
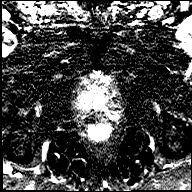
[im 115/572]
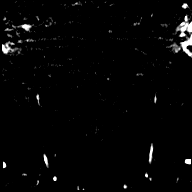
[im 153/572]
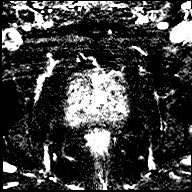
[im 191/572]
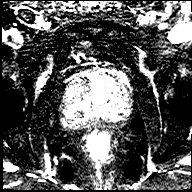
[im 229/572]
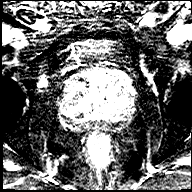
[im 267/572]
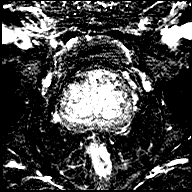
[im 305/572]
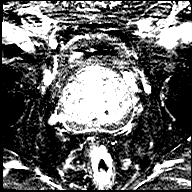
[im 343/572]
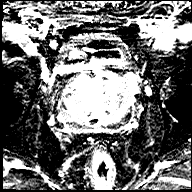
[im 381/572]
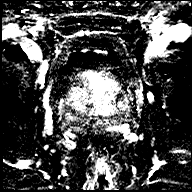
[im 419/572]
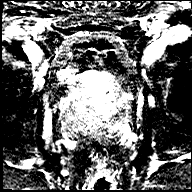
[im 457/572]
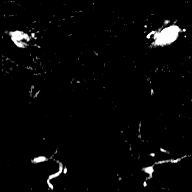
[im 495/572]
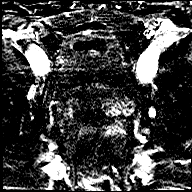
[im 533/572]
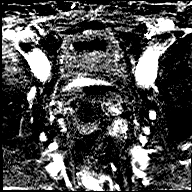
[im 572/572]
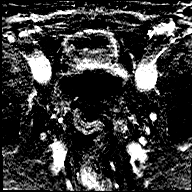

[Series 13: t1_vibe_dixon_tra_f · axial · 2.5mm · 0.91mm/px · z∈[-20,+177]mm · 2 of 80 slices shown]
[im 1/80]
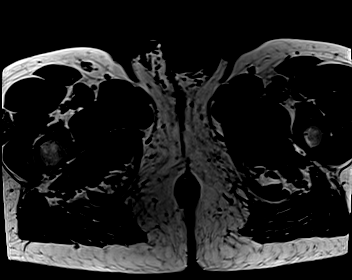
[im 80/80]
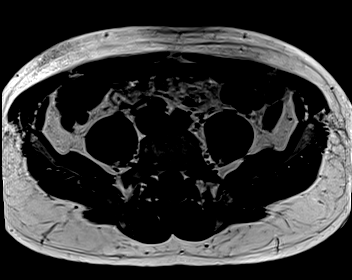

[Series 14: t1_vibe_dixon_tra_w · axial · 2.5mm · 0.91mm/px · z∈[-20,+177]mm · 2 of 80 slices shown]
[im 1/80]
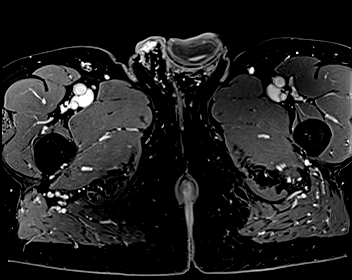
[im 80/80]
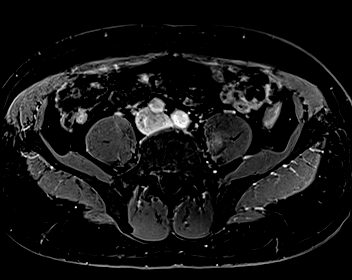

[48 of 48 positions shown; findings below may reference images not displayed]

FINDINGS: Prostate:

Region of interest # 1: PI-RADS category 4 lesion of the right
posterolateral peripheral zone of the base with low T2 signal and
mild focal early enhancement. This measures 0.99 cc (1.3 by 0.7 by
1.5 cm) and previously measured 0.82 cc. This lesion is shown on
image 8 series 8 and corresponds to the previous region of interest
# 1 discussed on prior MRI from [DATE].

Region of interest # 2: PI-RADS category 3 lesion of the right
anterior peripheral zone in the mid gland, with reduced T2 signal
but no focal early enhancement or substantially restricted
diffusion. This measures 0.27 cc (1.2 by 0.4 by 0.9 cm and is shown
for example on image 15 series 8. This was previously measured at
0.40 cc and was designated region of interest # 2 on the prior exam.

Region of interest # 3: PI-RADS category 3 lesion of the left
posterolateral peripheral zone at the apex, with reduced T2 signal
but no focal early enhancement or restricted diffusion. There is
some thinning of the apex in this vicinity, raising the possibility
that this could be a postinflammatory finding. Minimally increased
in conspicuity today compared to [DATE], shown for example on
image 19 series 8. This small lesion measures 0.32 cc (1.3 by 0.5 by
0.7 cm).

Encapsulated nodularity of the transition zone compatible with
benign prostatic hypertrophy.

Volume: 3D volumetric analysis: Prostate volume 102.44 cc (6.4 by
5.6 by 5.7 cm). This is minimally enlarged from prior measurement of
96.23 cc on [DATE].

Transcapsular spread:  Absent

Seminal vesicle involvement: Absent

Neurovascular bundle involvement: Absent

Pelvic adenopathy: Absent

Bone metastasis: Absent

Other findings: No supplemental non-categorized findings.
IMPRESSION: 1. Previous region of interest # 1 and # 2 are morphologically
relatively similar to the prior exam and have similar imaging
characteristics.
2. Slight increase in conspicuity of a small PI-RADS category 3
lesion of the left posterolateral peripheral zone the apex,
designated as region of interest # 3. This probably has an increased
likelihood of representing scarring given the thinning of the
peripheral zone in this vicinity, but qualifies as PI-RADS category
3.
3. Benign prostatic hypertrophy with prostatomegaly, the prostate
has mildly increased in volume, currently 102.44 cc.

## 2021-06-02 MED ORDER — GADOBENATE DIMEGLUMINE 529 MG/ML IV SOLN
19.0000 mL | Freq: Once | INTRAVENOUS | Status: AC | PRN
  Administered 2021-06-02: 19 mL via INTRAVENOUS

## 2021-07-12 DIAGNOSIS — C61 Malignant neoplasm of prostate: Secondary | ICD-10-CM | POA: Diagnosis not present

## 2021-07-15 DIAGNOSIS — C61 Malignant neoplasm of prostate: Secondary | ICD-10-CM | POA: Diagnosis not present

## 2021-07-15 DIAGNOSIS — D075 Carcinoma in situ of prostate: Secondary | ICD-10-CM | POA: Diagnosis not present

## 2021-07-28 DIAGNOSIS — R310 Gross hematuria: Secondary | ICD-10-CM | POA: Diagnosis not present

## 2021-11-29 DIAGNOSIS — C61 Malignant neoplasm of prostate: Secondary | ICD-10-CM | POA: Diagnosis not present

## 2021-11-30 DIAGNOSIS — H2513 Age-related nuclear cataract, bilateral: Secondary | ICD-10-CM | POA: Diagnosis not present

## 2021-11-30 DIAGNOSIS — R7303 Prediabetes: Secondary | ICD-10-CM | POA: Diagnosis not present

## 2021-11-30 DIAGNOSIS — H52203 Unspecified astigmatism, bilateral: Secondary | ICD-10-CM | POA: Diagnosis not present

## 2021-11-30 DIAGNOSIS — H5203 Hypermetropia, bilateral: Secondary | ICD-10-CM | POA: Diagnosis not present

## 2022-02-16 DIAGNOSIS — K635 Polyp of colon: Secondary | ICD-10-CM | POA: Diagnosis not present

## 2022-02-16 DIAGNOSIS — K648 Other hemorrhoids: Secondary | ICD-10-CM | POA: Diagnosis not present

## 2022-02-16 DIAGNOSIS — Z86010 Personal history of colonic polyps: Secondary | ICD-10-CM | POA: Diagnosis not present

## 2022-03-01 DIAGNOSIS — R3914 Feeling of incomplete bladder emptying: Secondary | ICD-10-CM | POA: Diagnosis not present

## 2022-03-01 DIAGNOSIS — C61 Malignant neoplasm of prostate: Secondary | ICD-10-CM | POA: Diagnosis not present

## 2022-03-01 DIAGNOSIS — N138 Other obstructive and reflux uropathy: Secondary | ICD-10-CM | POA: Diagnosis not present

## 2022-03-01 DIAGNOSIS — N401 Enlarged prostate with lower urinary tract symptoms: Secondary | ICD-10-CM | POA: Diagnosis not present

## 2022-03-22 DIAGNOSIS — C61 Malignant neoplasm of prostate: Secondary | ICD-10-CM | POA: Diagnosis not present

## 2022-03-22 DIAGNOSIS — N390 Urinary tract infection, site not specified: Secondary | ICD-10-CM | POA: Diagnosis not present

## 2022-03-22 DIAGNOSIS — R3914 Feeling of incomplete bladder emptying: Secondary | ICD-10-CM | POA: Diagnosis not present

## 2022-03-22 DIAGNOSIS — B957 Other staphylococcus as the cause of diseases classified elsewhere: Secondary | ICD-10-CM | POA: Diagnosis not present

## 2022-03-22 DIAGNOSIS — N401 Enlarged prostate with lower urinary tract symptoms: Secondary | ICD-10-CM | POA: Diagnosis not present

## 2022-04-13 DIAGNOSIS — Z125 Encounter for screening for malignant neoplasm of prostate: Secondary | ICD-10-CM | POA: Diagnosis not present

## 2022-04-13 DIAGNOSIS — R7989 Other specified abnormal findings of blood chemistry: Secondary | ICD-10-CM | POA: Diagnosis not present

## 2022-04-13 DIAGNOSIS — E559 Vitamin D deficiency, unspecified: Secondary | ICD-10-CM | POA: Diagnosis not present

## 2022-04-20 DIAGNOSIS — Z0000 Encounter for general adult medical examination without abnormal findings: Secondary | ICD-10-CM | POA: Diagnosis not present

## 2022-04-20 DIAGNOSIS — Z1331 Encounter for screening for depression: Secondary | ICD-10-CM | POA: Diagnosis not present

## 2022-04-20 DIAGNOSIS — R82998 Other abnormal findings in urine: Secondary | ICD-10-CM | POA: Diagnosis not present

## 2022-04-20 DIAGNOSIS — Z1339 Encounter for screening examination for other mental health and behavioral disorders: Secondary | ICD-10-CM | POA: Diagnosis not present

## 2022-04-20 DIAGNOSIS — C61 Malignant neoplasm of prostate: Secondary | ICD-10-CM | POA: Diagnosis not present

## 2022-04-20 DIAGNOSIS — R7303 Prediabetes: Secondary | ICD-10-CM | POA: Diagnosis not present

## 2022-07-04 DIAGNOSIS — R7989 Other specified abnormal findings of blood chemistry: Secondary | ICD-10-CM | POA: Diagnosis not present

## 2022-07-19 DIAGNOSIS — C61 Malignant neoplasm of prostate: Secondary | ICD-10-CM | POA: Diagnosis not present

## 2022-07-24 DIAGNOSIS — C61 Malignant neoplasm of prostate: Secondary | ICD-10-CM | POA: Diagnosis not present

## 2022-07-24 DIAGNOSIS — N401 Enlarged prostate with lower urinary tract symptoms: Secondary | ICD-10-CM | POA: Diagnosis not present

## 2022-07-24 DIAGNOSIS — R3914 Feeling of incomplete bladder emptying: Secondary | ICD-10-CM | POA: Diagnosis not present

## 2022-10-30 DIAGNOSIS — H9313 Tinnitus, bilateral: Secondary | ICD-10-CM | POA: Diagnosis not present

## 2022-12-22 DIAGNOSIS — H524 Presbyopia: Secondary | ICD-10-CM | POA: Diagnosis not present

## 2022-12-22 DIAGNOSIS — R7303 Prediabetes: Secondary | ICD-10-CM | POA: Diagnosis not present

## 2022-12-22 DIAGNOSIS — H2513 Age-related nuclear cataract, bilateral: Secondary | ICD-10-CM | POA: Diagnosis not present

## 2022-12-22 DIAGNOSIS — H3530 Unspecified macular degeneration: Secondary | ICD-10-CM | POA: Diagnosis not present

## 2023-01-15 DIAGNOSIS — C61 Malignant neoplasm of prostate: Secondary | ICD-10-CM | POA: Diagnosis not present

## 2023-01-24 DIAGNOSIS — C61 Malignant neoplasm of prostate: Secondary | ICD-10-CM | POA: Diagnosis not present

## 2023-01-24 DIAGNOSIS — N401 Enlarged prostate with lower urinary tract symptoms: Secondary | ICD-10-CM | POA: Diagnosis not present

## 2023-01-24 DIAGNOSIS — R3914 Feeling of incomplete bladder emptying: Secondary | ICD-10-CM | POA: Diagnosis not present

## 2023-04-16 DIAGNOSIS — C61 Malignant neoplasm of prostate: Secondary | ICD-10-CM | POA: Diagnosis not present

## 2023-04-16 DIAGNOSIS — R7989 Other specified abnormal findings of blood chemistry: Secondary | ICD-10-CM | POA: Diagnosis not present

## 2023-04-16 DIAGNOSIS — R7303 Prediabetes: Secondary | ICD-10-CM | POA: Diagnosis not present

## 2023-04-16 DIAGNOSIS — E559 Vitamin D deficiency, unspecified: Secondary | ICD-10-CM | POA: Diagnosis not present

## 2023-04-24 DIAGNOSIS — Z23 Encounter for immunization: Secondary | ICD-10-CM | POA: Diagnosis not present

## 2023-04-24 DIAGNOSIS — R82998 Other abnormal findings in urine: Secondary | ICD-10-CM | POA: Diagnosis not present

## 2023-04-24 DIAGNOSIS — Z0000 Encounter for general adult medical examination without abnormal findings: Secondary | ICD-10-CM | POA: Diagnosis not present

## 2023-04-24 DIAGNOSIS — Z1331 Encounter for screening for depression: Secondary | ICD-10-CM | POA: Diagnosis not present

## 2023-04-24 DIAGNOSIS — C61 Malignant neoplasm of prostate: Secondary | ICD-10-CM | POA: Diagnosis not present

## 2023-07-05 DIAGNOSIS — J3489 Other specified disorders of nose and nasal sinuses: Secondary | ICD-10-CM | POA: Diagnosis not present

## 2023-07-05 DIAGNOSIS — J029 Acute pharyngitis, unspecified: Secondary | ICD-10-CM | POA: Diagnosis not present

## 2023-07-05 DIAGNOSIS — B349 Viral infection, unspecified: Secondary | ICD-10-CM | POA: Diagnosis not present

## 2023-07-30 DIAGNOSIS — C61 Malignant neoplasm of prostate: Secondary | ICD-10-CM | POA: Diagnosis not present

## 2023-08-07 DIAGNOSIS — N401 Enlarged prostate with lower urinary tract symptoms: Secondary | ICD-10-CM | POA: Diagnosis not present

## 2023-08-07 DIAGNOSIS — R3912 Poor urinary stream: Secondary | ICD-10-CM | POA: Diagnosis not present

## 2023-08-07 DIAGNOSIS — C61 Malignant neoplasm of prostate: Secondary | ICD-10-CM | POA: Diagnosis not present

## 2023-08-14 ENCOUNTER — Encounter: Payer: Self-pay | Admitting: Urology

## 2023-08-14 ENCOUNTER — Other Ambulatory Visit: Payer: Self-pay | Admitting: Urology

## 2023-08-14 DIAGNOSIS — C61 Malignant neoplasm of prostate: Secondary | ICD-10-CM

## 2023-08-28 DIAGNOSIS — N390 Urinary tract infection, site not specified: Secondary | ICD-10-CM | POA: Diagnosis not present

## 2023-10-03 ENCOUNTER — Encounter: Payer: Self-pay | Admitting: Urology

## 2023-10-05 ENCOUNTER — Ambulatory Visit
Admission: RE | Admit: 2023-10-05 | Discharge: 2023-10-05 | Disposition: A | Discharge status: Home / Self Care | Payer: BC Managed Care – PPO | Source: Ambulatory Visit | Attending: Urology

## 2023-10-05 DIAGNOSIS — C61 Malignant neoplasm of prostate: Secondary | ICD-10-CM

## 2023-10-05 DIAGNOSIS — R972 Elevated prostate specific antigen [PSA]: Secondary | ICD-10-CM | POA: Diagnosis not present

## 2023-10-05 MED ORDER — GADOPICLENOL 0.5 MMOL/ML IV SOLN
9.0000 mL | Freq: Once | INTRAVENOUS | Status: AC | PRN
  Administered 2023-10-05: 9 mL via INTRAVENOUS

## 2023-10-10 DIAGNOSIS — N341 Nonspecific urethritis: Secondary | ICD-10-CM | POA: Diagnosis not present

## 2023-10-10 DIAGNOSIS — R8271 Bacteriuria: Secondary | ICD-10-CM | POA: Diagnosis not present

## 2023-10-31 DIAGNOSIS — R972 Elevated prostate specific antigen [PSA]: Secondary | ICD-10-CM | POA: Diagnosis not present

## 2023-11-15 DIAGNOSIS — N401 Enlarged prostate with lower urinary tract symptoms: Secondary | ICD-10-CM | POA: Diagnosis not present

## 2023-11-15 DIAGNOSIS — C61 Malignant neoplasm of prostate: Secondary | ICD-10-CM | POA: Diagnosis not present

## 2023-11-15 DIAGNOSIS — R3914 Feeling of incomplete bladder emptying: Secondary | ICD-10-CM | POA: Diagnosis not present

## 2024-01-01 DIAGNOSIS — H2513 Age-related nuclear cataract, bilateral: Secondary | ICD-10-CM | POA: Diagnosis not present

## 2024-01-01 DIAGNOSIS — H524 Presbyopia: Secondary | ICD-10-CM | POA: Diagnosis not present

## 2024-01-01 DIAGNOSIS — H5203 Hypermetropia, bilateral: Secondary | ICD-10-CM | POA: Diagnosis not present

## 2024-01-01 DIAGNOSIS — H52203 Unspecified astigmatism, bilateral: Secondary | ICD-10-CM | POA: Diagnosis not present

## 2024-01-01 DIAGNOSIS — R7303 Prediabetes: Secondary | ICD-10-CM | POA: Diagnosis not present

## 2024-01-06 ENCOUNTER — Other Ambulatory Visit: Payer: Self-pay

## 2024-01-06 ENCOUNTER — Emergency Department (HOSPITAL_BASED_OUTPATIENT_CLINIC_OR_DEPARTMENT_OTHER)
Admission: EM | Admit: 2024-01-06 | Discharge: 2024-01-07 | Disposition: A | Discharge status: Home / Self Care | Attending: Emergency Medicine | Admitting: Emergency Medicine

## 2024-01-06 ENCOUNTER — Encounter (HOSPITAL_BASED_OUTPATIENT_CLINIC_OR_DEPARTMENT_OTHER): Payer: Self-pay

## 2024-01-06 DIAGNOSIS — N202 Calculus of kidney with calculus of ureter: Secondary | ICD-10-CM | POA: Diagnosis not present

## 2024-01-06 DIAGNOSIS — N200 Calculus of kidney: Secondary | ICD-10-CM | POA: Diagnosis not present

## 2024-01-06 DIAGNOSIS — R109 Unspecified abdominal pain: Secondary | ICD-10-CM | POA: Diagnosis not present

## 2024-01-06 NOTE — ED Triage Notes (Signed)
 Pt reports that he had sudden onset of left flank pain x 3 hours ago. Pt has history of kidney stones and this feels similar. Denies dysuria. Denies nausea.

## 2024-01-07 ENCOUNTER — Emergency Department (HOSPITAL_BASED_OUTPATIENT_CLINIC_OR_DEPARTMENT_OTHER)

## 2024-01-07 DIAGNOSIS — N132 Hydronephrosis with renal and ureteral calculous obstruction: Secondary | ICD-10-CM | POA: Diagnosis not present

## 2024-01-07 DIAGNOSIS — N400 Enlarged prostate without lower urinary tract symptoms: Secondary | ICD-10-CM | POA: Diagnosis not present

## 2024-01-07 DIAGNOSIS — R109 Unspecified abdominal pain: Secondary | ICD-10-CM | POA: Diagnosis not present

## 2024-01-07 LAB — COMPREHENSIVE METABOLIC PANEL WITH GFR
ALT: 30 U/L (ref 0–44)
AST: 30 U/L (ref 15–41)
Albumin: 4.8 g/dL (ref 3.5–5.0)
Alkaline Phosphatase: 101 U/L (ref 38–126)
Anion gap: 12 (ref 5–15)
BUN: 14 mg/dL (ref 8–23)
CO2: 27 mmol/L (ref 22–32)
Calcium: 9.9 mg/dL (ref 8.9–10.3)
Chloride: 105 mmol/L (ref 98–111)
Creatinine, Ser: 1.15 mg/dL (ref 0.61–1.24)
GFR, Estimated: >60 mL/min (ref >60)
Glucose, Bld: 149 mg/dL — ABNORMAL HIGH (ref 70–99)
Potassium: 4.3 mmol/L (ref 3.5–5.1)
Sodium: 144 mmol/L (ref 135–145)
Total Bilirubin: 0.4 mg/dL (ref 0.0–1.2)
Total Protein: 7.1 g/dL (ref 6.5–8.1)

## 2024-01-07 LAB — URINALYSIS, ROUTINE W REFLEX MICROSCOPIC
Bacteria, UA: NONE SEEN
Bilirubin Urine: NEGATIVE
Glucose, UA: NEGATIVE mg/dL
Leukocytes,Ua: NEGATIVE
Nitrite: NEGATIVE
RBC / HPF: >50 RBC/hpf (ref 0–5)
Specific Gravity, Urine: 1.02 (ref 1.005–1.030)
pH: 6 (ref 5.0–8.0)

## 2024-01-07 LAB — CBC
HCT: 44.3 % (ref 39.0–52.0)
Hemoglobin: 14.5 g/dL (ref 13.0–17.0)
MCH: 28.8 pg (ref 26.0–34.0)
MCHC: 32.7 g/dL (ref 30.0–36.0)
MCV: 88.1 fL (ref 80.0–100.0)
Platelets: 191 10*3/uL (ref 150–400)
RBC: 5.03 MIL/uL (ref 4.22–5.81)
RDW: 14.2 % (ref 11.5–15.5)
WBC: 6.5 10*3/uL (ref 4.0–10.5)
nRBC: 0 % (ref 0.0–0.2)

## 2024-01-07 MED ORDER — KETOROLAC TROMETHAMINE 15 MG/ML IJ SOLN
15.0000 mg | Freq: Once | INTRAMUSCULAR | Status: AC
Start: 2024-01-07 — End: 2024-01-07
  Administered 2024-01-07: 15 mg via INTRAVENOUS
  Filled 2024-01-07: qty 1

## 2024-01-07 MED ORDER — SODIUM CHLORIDE 0.9 % IV BOLUS
1000.0000 mL | Freq: Once | INTRAVENOUS | Status: AC
  Administered 2024-01-07: 1000 mL via INTRAVENOUS

## 2024-01-07 MED ORDER — ONDANSETRON HCL 4 MG/2ML IJ SOLN
4.0000 mg | Freq: Once | INTRAMUSCULAR | Status: AC | PRN
  Administered 2024-01-07: 4 mg via INTRAVENOUS
  Filled 2024-01-07: qty 2

## 2024-01-07 MED ORDER — OXYCODONE HCL 5 MG PO TABS: 5.0000 mg | ORAL_TABLET | ORAL | 0 refills | Status: AC | PRN

## 2024-01-07 NOTE — Discharge Instructions (Addendum)
 You were evaluated in the Emergency Department and after careful evaluation, we did not find any emergent condition requiring admission or further testing in the hospital.  Your exam/testing today was overall reassuring.  Symptoms likely due to a kidney stone.  Recommend follow-up with [facility].  Recommend Tylenol  1000 mg every 4-6 hours and/or Motrin 600 mg every 4-6 hours for pain.  You can use the oxycodone medication for more significant pain.  Please return to the Emergency Department if you experience any worsening of your condition.  Thank you for allowing us  to be a part of your care.

## 2024-01-07 NOTE — ED Provider Notes (Signed)
 [facility] Emergency Department Provider Note MRN:  [id number]  Arrival date & time: 01/07/24     Chief Complaint   Flank Pain   History of Present Illness   Edgar Rodriguez is a 63 y.o. year-old male with no pertinent past medical history presenting to the ED with chief complaint of flank pain.  Left flank pain starting acutely this evening, not going away, began getting worse and becoming associated with nausea.  Has had similar pain due to kidney stone in the past.  No recent fever, no trauma, no other complaints.  Review of Systems  A thorough review of systems was obtained and all systems are negative except as noted in the HPI and PMH.   Patient's Health History    Past Medical History:  Diagnosis Date   Dupuytren's contracture of right hand 09/2014   right small finger    Past Surgical History:  Procedure Laterality Date   DUPUYTREN CONTRACTURE RELEASE Right 2007   FASCIECTOMY Right 09/22/2014   Procedure: RIGHT PALM AND SMALL FINGER FASCIECTOMY;  Surgeon: Brunilda Capra, MD;  Location: [facility];  Service: Orthopedics;  Laterality: Right;   TONSILLECTOMY     age 63    History reviewed. No pertinent family history.  Social History   Socioeconomic History   Marital status: Single    Spouse name: Not on file   Number of children: Not on file   Years of education: Not on file   Highest education level: Not on file  Occupational History   Not on file  Tobacco Use   Smoking status: Never   Smokeless tobacco: Never  Substance and Sexual Activity   Alcohol  use: Yes    Comment: moderate use   Drug use: No   Sexual activity: Not on file  Other Topics Concern   Not on file  Social History Narrative   Not on file   Social Drivers of Health   Financial Resource Strain: Not on file  Food Insecurity: Not on file  Transportation Needs: Not on file  Physical Activity: Not on file  Stress: Not on file  Social Connections: Not  on file  Intimate Partner Violence: Not on file     Physical Exam   Vitals:   01/06/24 2356 01/07/24 0230  BP:  (!) 145/89  Pulse: 82 82  Resp:  16  Temp:  98 F (36.7 C)  SpO2: 95% 97%    CONSTITUTIONAL: Well-appearing, NAD NEURO/PSYCH:  Alert and oriented x 3, no focal deficits EYES:  eyes equal and reactive ENT/NECK:  no LAD, no JVD CARDIO: Regular rate, well-perfused, normal S1 and S2 PULM:  CTAB no wheezing or rhonchi GI/GU:  non-distended, non-tender MSK/SPINE:  No gross deformities, no edema SKIN:  no rash, atraumatic   *Additional and/or pertinent findings included in MDM below  Diagnostic and Interventional Summary    EKG Interpretation Date/Time:    Ventricular Rate:    PR Interval:    QRS Duration:    QT Interval:    QTC Calculation:   R Axis:      Text Interpretation:         Labs Reviewed  COMPREHENSIVE METABOLIC PANEL WITH GFR - Abnormal; Notable for the following components:      Result Value   Glucose, Bld 149 (*)    All other components within normal limits  URINALYSIS, ROUTINE W REFLEX MICROSCOPIC - Abnormal; Notable for the following components:   Hgb urine dipstick LARGE (*)  Ketones, ur TRACE (*)    Protein, ur TRACE (*)    All other components within normal limits  CBC    CT RENAL STONE STUDY  Final Result      Medications  sodium chloride 0.9 % bolus 1,000 mL ( Intravenous Stopped 01/07/24 0208)  ketorolac (TORADOL) 15 MG/ML injection 15 mg (15 mg Intravenous Given 01/07/24 0043)  ondansetron  (ZOFRAN ) injection 4 mg (4 mg Intravenous Given 01/07/24 0043)     Procedures  /  Critical Care Procedures  ED Course and Medical Decision Making  Initial Impression and Ddx Suspect recurrent kidney stone, pyelonephritis is another consideration.  Pain seems to be waxing and waning, currently mild in severity.  Past medical/surgical history that increases complexity of ED encounter: Kidney stone  Interpretation of Diagnostics I  personally reviewed the Laboratory Testing and my interpretation is as follows: No significant blood count or electrolyte disturbance.  CT confirms 3 mm stone.  Urinalysis without strong evidence to suggest infection.  Patient Reassessment and Ultimate Disposition/Management     Patient's pain is well-controlled, reassuring workup, appropriate for discharge.  Patient management required discussion with the following services or consulting groups:  None  Complexity of Problems Addressed Acute illness or injury that poses threat of life of bodily function  Additional Data Reviewed and Analyzed Further history obtained from: Prior labs/imaging results  Additional Factors Impacting ED Encounter Risk Consideration of hospitalization  Merrick Abe. Harless Lien, MD [facility] Emergency Medicine [facility] [email or web address]  Final Clinical Impressions(s) / ED Diagnoses     ICD-10-CM   1. Kidney stone  N20.0       ED Discharge Orders          Ordered    oxyCODONE (ROXICODONE) 5 MG immediate release tablet  Every 4 hours PRN        01/07/24 0312             Discharge Instructions Discussed with and Provided to Patient:     Discharge Instructions      You were evaluated in the Emergency Department and after careful evaluation, we did not find any emergent condition requiring admission or further testing in the hospital.  Your exam/testing today was overall reassuring.  Symptoms likely due to a kidney stone.  Re[facility]up with [facility].  Recommend Tylenol  1000 mg every 4-6 hours and/or Motrin 600 mg every 4-6 hours for pain.  You can use the oxycodone medication for more significant pain.  Please return to the Emergency Department if you experience any worsening of your condition.  Thank you for allowing us  to be a part of your care.        Edson Graces, MD 01/07/24 [phone]

## 2024-04-23 DIAGNOSIS — C61 Malignant neoplasm of prostate: Secondary | ICD-10-CM | POA: Diagnosis not present

## 2024-04-23 DIAGNOSIS — R7303 Prediabetes: Secondary | ICD-10-CM | POA: Diagnosis not present

## 2024-04-23 DIAGNOSIS — E559 Vitamin D deficiency, unspecified: Secondary | ICD-10-CM | POA: Diagnosis not present

## 2024-04-23 DIAGNOSIS — R7989 Other specified abnormal findings of blood chemistry: Secondary | ICD-10-CM | POA: Diagnosis not present

## 2024-04-30 DIAGNOSIS — Z23 Encounter for immunization: Secondary | ICD-10-CM | POA: Diagnosis not present

## 2024-04-30 DIAGNOSIS — R82998 Other abnormal findings in urine: Secondary | ICD-10-CM | POA: Diagnosis not present

## 2024-04-30 DIAGNOSIS — Z1339 Encounter for screening examination for other mental health and behavioral disorders: Secondary | ICD-10-CM | POA: Diagnosis not present

## 2024-04-30 DIAGNOSIS — C61 Malignant neoplasm of prostate: Secondary | ICD-10-CM | POA: Diagnosis not present

## 2024-04-30 DIAGNOSIS — Z1331 Encounter for screening for depression: Secondary | ICD-10-CM | POA: Diagnosis not present

## 2024-04-30 DIAGNOSIS — Z0000 Encounter for general adult medical examination without abnormal findings: Secondary | ICD-10-CM | POA: Diagnosis not present

## 2024-05-19 DIAGNOSIS — C61 Malignant neoplasm of prostate: Secondary | ICD-10-CM | POA: Diagnosis not present

## 2024-05-21 DIAGNOSIS — C61 Malignant neoplasm of prostate: Secondary | ICD-10-CM | POA: Diagnosis not present

## 2024-05-21 DIAGNOSIS — N401 Enlarged prostate with lower urinary tract symptoms: Secondary | ICD-10-CM | POA: Diagnosis not present

## 2024-05-21 DIAGNOSIS — R3914 Feeling of incomplete bladder emptying: Secondary | ICD-10-CM | POA: Diagnosis not present
